# Patient Record
Sex: Male | Born: 1980 | Race: White | Hispanic: No | Marital: Single | Smoking: Current every day smoker
Health system: Southern US, Community
[De-identification: ages and names within clinical notes are randomized; demographics above are authoritative.]

## PROBLEM LIST (undated history)

## (undated) DIAGNOSIS — K219 Gastro-esophageal reflux disease without esophagitis: Secondary | ICD-10-CM

## (undated) DIAGNOSIS — R079 Chest pain, unspecified: Secondary | ICD-10-CM

## (undated) HISTORY — PX: OTHER SURGICAL HISTORY: SHX169

## (undated) HISTORY — PX: LAMINECTOMY: SHX219

## (undated) HISTORY — DX: Gastro-esophageal reflux disease without esophagitis: K21.9

## (undated) HISTORY — PX: SHOULDER SURGERY: SHX246

## (undated) HISTORY — DX: Chest pain, unspecified: R07.9

---

## 2016-03-11 ENCOUNTER — Other Ambulatory Visit: Payer: Self-pay | Admitting: Orthopaedic Surgery

## 2016-03-11 DIAGNOSIS — Z77018 Contact with and (suspected) exposure to other hazardous metals: Secondary | ICD-10-CM

## 2016-03-11 DIAGNOSIS — M47816 Spondylosis without myelopathy or radiculopathy, lumbar region: Secondary | ICD-10-CM

## 2016-03-23 ENCOUNTER — Ambulatory Visit
Admission: RE | Admit: 2016-03-23 | Discharge: 2016-03-23 | Disposition: A | Discharge status: Home / Self Care | Payer: 59 | Source: Ambulatory Visit | Attending: Orthopaedic Surgery | Admitting: Orthopaedic Surgery

## 2016-03-23 DIAGNOSIS — M47816 Spondylosis without myelopathy or radiculopathy, lumbar region: Secondary | ICD-10-CM

## 2016-03-23 DIAGNOSIS — Z77018 Contact with and (suspected) exposure to other hazardous metals: Secondary | ICD-10-CM

## 2020-07-09 ENCOUNTER — Other Ambulatory Visit: Payer: Self-pay

## 2020-07-09 ENCOUNTER — Encounter (HOSPITAL_COMMUNITY): Payer: Self-pay | Admitting: Emergency Medicine

## 2020-07-09 ENCOUNTER — Emergency Department (HOSPITAL_COMMUNITY)
Admission: EM | Admit: 2020-07-09 | Discharge: 2020-07-09 | Disposition: A | Discharge status: Home / Self Care | Payer: Worker's Compensation | Attending: Emergency Medicine | Admitting: Emergency Medicine

## 2020-07-09 ENCOUNTER — Emergency Department (HOSPITAL_COMMUNITY): Payer: Worker's Compensation

## 2020-07-09 DIAGNOSIS — S62635A Displaced fracture of distal phalanx of left ring finger, initial encounter for closed fracture: Secondary | ICD-10-CM | POA: Diagnosis not present

## 2020-07-09 DIAGNOSIS — Y990 Civilian activity done for income or pay: Secondary | ICD-10-CM | POA: Diagnosis not present

## 2020-07-09 DIAGNOSIS — W231XXA Caught, crushed, jammed, or pinched between stationary objects, initial encounter: Secondary | ICD-10-CM | POA: Diagnosis not present

## 2020-07-09 DIAGNOSIS — S61205A Unspecified open wound of left ring finger without damage to nail, initial encounter: Secondary | ICD-10-CM | POA: Diagnosis present

## 2020-07-09 DIAGNOSIS — Z23 Encounter for immunization: Secondary | ICD-10-CM | POA: Insufficient documentation

## 2020-07-09 DIAGNOSIS — S62639A Displaced fracture of distal phalanx of unspecified finger, initial encounter for closed fracture: Secondary | ICD-10-CM

## 2020-07-09 DIAGNOSIS — S61215A Laceration without foreign body of left ring finger without damage to nail, initial encounter: Secondary | ICD-10-CM | POA: Diagnosis not present

## 2020-07-09 MED ORDER — LIDOCAINE HCL (PF) 1 % IJ SOLN
10.0000 mL | Freq: Once | INTRAMUSCULAR | Status: AC
  Administered 2020-07-09: 10 mL
  Filled 2020-07-09: qty 10

## 2020-07-09 MED ORDER — CEFAZOLIN SODIUM 1 G IJ SOLR
1.0000 g | Freq: Once | INTRAMUSCULAR | Status: DC
  Filled 2020-07-09: qty 10

## 2020-07-09 MED ORDER — CEPHALEXIN 250 MG PO CAPS
500.0000 mg | ORAL_CAPSULE | Freq: Once | ORAL | Status: AC
  Administered 2020-07-09: 500 mg via ORAL
  Filled 2020-07-09: qty 2

## 2020-07-09 MED ORDER — TETANUS-DIPHTH-ACELL PERTUSSIS 5-2.5-18.5 LF-MCG/0.5 IM SUSY
0.5000 mL | PREFILLED_SYRINGE | Freq: Once | INTRAMUSCULAR | Status: AC
  Administered 2020-07-09: 0.5 mL via INTRAMUSCULAR
  Filled 2020-07-09: qty 0.5

## 2020-07-09 MED ORDER — CEPHALEXIN 500 MG PO CAPS: 500.0000 mg | ORAL_CAPSULE | Freq: Four times a day (QID) | ORAL | 0 refills | Status: DC

## 2020-07-09 NOTE — Discharge Instructions (Addendum)
Keep finger clean and dry in finger splint, you can remove the splint to change dressing once daily.  Keep dry for the first 24 hours then you may shower and wash her hands as needed but avoid soaking the finger in any water.  Take prescribed Keflex to help prevent infection.  Use ibuprofen and Tylenol as well as ice and elevation to help with pain.  Your x-ray did show a small fracture at the tip of your finger.  Please call to schedule follow-up appointment with Dr. Roney Mans with hand surgery  Monitor closely for signs of infection such as redness, swelling, increasing pain or puslike drainage, if this occurs please return to the ED.

## 2020-07-09 NOTE — ED Triage Notes (Signed)
Pt c/o left ring finger pain after getting it caught in a door. Seen at [facility] and sent here for further evaluation. Finger wrapped on arrival, bleeding controlled.

## 2020-07-09 NOTE — ED Provider Notes (Signed)
[facility] EMERGENCY DEPARTMENT Provider Note   CSN: [id number] Arrival date & time: 07/09/20  1504     History Chief Complaint  Patient presents with  . Finger Injury    Jerry Gutierrez is a 40 y.o. male.  Jerry Gutierrez is a 40 y.o. male who is otherwise healthy, presents to the ED from [facility] for evaluation of injury to the left ring finger.  Patient reports he got it caught in a hinged tailgate on the back of a truck as he was trying to step down and lost his balance grabbing the tailgate.  He reports he felt like his finger just popped open from the pressure and he has a laceration over the digit pad.  No damage to the nail noted.  He was initially seen at employee health but they felt that this was beyond what they could address and sent him to the emergency department for further evaluation.  He is able to flex and extend the finger and denies any numbness or tingling.  Reports there was initially bleeding but it is now controlled.  Unsure of last tetanus vaccine.  No other aggravating or alleviating factors.        History reviewed. No pertinent past medical history.  There are no problems to display for this patient.   History reviewed. No pertinent surgical history.     No family history on file.     Home Medications Prior to Admission medications   Medication Sig Start Date End Date Taking? Authorizing Provider  cephALEXin (KEFLEX) 500 MG capsule Take 1 capsule (500 mg total) by mouth 4 (four) times daily. 07/09/20  Yes Dartha Lodge, PA-C    Allergies    Penicillins  Review of Systems   Review of Systems  Constitutional: Negative for chills and fever.  Musculoskeletal: Negative for arthralgias and myalgias.  Skin: Positive for wound. Negative for color change.  Neurological: Negative for weakness and numbness.  All other systems reviewed and are negative.   Physical Exam Updated Vital Signs BP (!) 148/93 (BP Location: Right  Arm)   Pulse 74   Temp 99 F (37.2 C) (Temporal)   Resp 16   SpO2 100%   Physical Exam Vitals and nursing note reviewed.  Constitutional:      General: He is not in acute distress.    Appearance: Normal appearance. He is well-developed and well-nourished. He is not ill-appearing or diaphoretic.  HENT:     Head: Normocephalic and atraumatic.  Eyes:     General:        Right eye: No discharge.        Left eye: No discharge.  Pulmonary:     Effort: Pulmonary effort is normal. No respiratory distress.  Musculoskeletal:     Comments: 3 cm laceration extending from the lateral aspect of the finger to the medial digit pad of the left ring finger, no active bleeding, appears quite superficial no visible bone or tendon, full range of motion, normal sensation and cap refill.  No damage to the nail or subungual hematoma noted.  Skin:    General: Skin is warm and dry.  Neurological:     Mental Status: He is alert.     Coordination: Coordination normal.  Psychiatric:        Mood and Affect: Mood and affect normal.        Behavior: Behavior normal.     ED Results / Procedures / Treatments   Labs (all labs  ordered are listed, but only abnormal results are displayed) Labs Reviewed - No data to display  EKG None  Radiology DG Hand Complete Left  Result Date: 07/09/2020 CLINICAL DATA:  Finger injury EXAM: LEFT HAND - COMPLETE 3+ VIEW COMPARISON:  None. FINDINGS: Acute mildly comminuted and displaced fracture involving the lateral tuft of the fourth distal phalanx. No subluxation. No radiopaque foreign body. IMPRESSION: Acute mildly comminuted and displaced fracture involving the lateral tuft of the fourth distal phalanx. Electronically Signed   By: Jasmine Pang M.D.   On: 07/09/2020 16:14    Procedures .Marland KitchenLaceration Repair  Date/Time: 07/09/2020 6:55 PM Performed by: Dartha Lodge, PA-C Authorized by: Dartha Lodge, PA-C   Consent:    Consent obtained:  Verbal   Consent given by:   Patient   Risks, benefits, and alternatives were discussed: yes     Risks discussed:  Pain, infection, need for additional repair, poor cosmetic result, nerve damage and poor wound healing Universal protocol:    Procedure explained and questions answered to patient or proxy's satisfaction: yes     Patient identity confirmed:  Verbally with patient Anesthesia:    Anesthesia method:  Nerve block   Block needle gauge:  25 G   Block anesthetic:  Lidocaine 1% w/o epi   Block injection procedure:  Incremental injection, introduced needle and anatomic landmarks palpated   Block outcome:  Anesthesia achieved Laceration details:    Location:  Finger   Finger location:  L ring finger   Length (cm):  3   Depth (mm):  5 Pre-procedure details:    Preparation:  Patient was prepped and draped in usual sterile fashion and imaging obtained to evaluate for foreign bodies Exploration:    Hemostasis achieved with:  Direct pressure   Imaging obtained: x-ray     Imaging outcome: foreign body not noted     Wound exploration: wound explored through full range of motion and entire depth of wound visualized     Wound extent: areolar tissue violated and underlying fracture     Wound extent: no foreign bodies/material noted, no nerve damage noted, no tendon damage noted and no vascular damage noted     Wound extent comment:  There is an underlying distal tuft fracture, but the laceration is not directly over the bony area and the laceration is much more superficial Treatment:    Area cleansed with:  Saline   Amount of cleaning:  Extensive   Irrigation solution:  Sterile saline   Irrigation volume:  1L   Irrigation method:  Pressure wash Skin repair:    Repair method:  Sutures   Suture size:  5-0   Suture material:  Prolene   Suture technique:  Simple interrupted   Number of sutures:  5 Approximation:    Approximation:  Close Repair type:    Repair type:  Simple Post-procedure details:    Dressing:   Non-adherent dressing and splint for protection   Procedure completion:  Tolerated well, no immediate complications     Medications Ordered in ED Medications  ceFAZolin (ANCEF) injection 1 g (has no administration in time range)  lidocaine (PF) (XYLOCAINE) 1 % injection 10 mL (10 mLs Infiltration Given by Other 07/09/20 1809)  Tdap (BOOSTRIX) injection 0.5 mL (0.5 mLs Intramuscular Given 07/09/20 1809)    ED Course  I have reviewed the triage vital signs and the nursing notes.  Pertinent labs & imaging results that were available during my care of the patient were reviewed  by me and considered in my medical decision making (see chart for details).    MDM Rules/Calculators/A&P                         40 year old male presents with laceration to the left ring finger after he got it stuck in the truck tailgate.  No nailbed injury or subungual hematoma, 3 cm laceration extending from the lateral aspect of the finger onto the digit pad with no active bleeding, appears fairly superficial with no visible tendon or bone, full range of motion of the finger.  X-ray does show very small comminuted distal tuft fracture, but laceration does not directly overlie this area of the fracture communicate with bone therefore do not feel it is an open fracture.  Regardless injury occurred and gloved hand, will treat with antibiotics.  Tetanus updated.  Digital block completed and wound copiously irrigated with a liter of saline.  Closed with simple interrupted sutures with good cosmesis.  Dressing applied and patient placed in finger splint will have him follow-up with hand surgery.  Suture removal in 7-10 days.  Return precautions provided.  Discharged home in good condition.  Final Clinical Impression(s) / ED Diagnoses Final diagnoses:  Laceration of left ring finger without foreign body without damage to nail, initial encounter  Closed fracture of tuft of distal phalanx of finger    Rx / DC Orders ED  Discharge Orders         Ordered    cephALEXin (KEFLEX) 500 MG capsule  4 times daily        07/09/20 1853           Legrand Rams 07/10/20 0109    Gerhard Munch, MD 07/12/20 2356

## 2020-07-19 ENCOUNTER — Other Ambulatory Visit: Payer: Self-pay

## 2020-07-19 ENCOUNTER — Ambulatory Visit (HOSPITAL_COMMUNITY)
Admission: EM | Admit: 2020-07-19 | Discharge: 2020-07-19 | Disposition: A | Discharge status: Home / Self Care | Payer: Managed Care, Other (non HMO)

## 2020-07-19 DIAGNOSIS — Z4802 Encounter for removal of sutures: Secondary | ICD-10-CM

## 2020-07-19 NOTE — ED Triage Notes (Signed)
Presents for suture removal to left ring finger; sutures placed 07/09/20.  No S/S infections; pt denies any concerns.  Wound well-approximated with sutures intact.

## 2021-07-28 ENCOUNTER — Other Ambulatory Visit: Payer: Self-pay

## 2021-07-28 DIAGNOSIS — R079 Chest pain, unspecified: Secondary | ICD-10-CM | POA: Insufficient documentation

## 2021-07-28 DIAGNOSIS — F419 Anxiety disorder, unspecified: Secondary | ICD-10-CM | POA: Insufficient documentation

## 2021-07-28 DIAGNOSIS — K219 Gastro-esophageal reflux disease without esophagitis: Secondary | ICD-10-CM | POA: Insufficient documentation

## 2021-07-28 HISTORY — DX: Anxiety disorder, unspecified: F41.9

## 2021-07-29 ENCOUNTER — Other Ambulatory Visit: Payer: Self-pay

## 2021-07-29 ENCOUNTER — Ambulatory Visit: Payer: Managed Care, Other (non HMO) | Admitting: Cardiology

## 2021-07-29 ENCOUNTER — Encounter: Payer: Self-pay | Admitting: *Deleted

## 2021-07-29 VITALS — BP 170/80 | HR 82 | Ht 73.0 in | Wt 187.0 lb

## 2021-07-29 DIAGNOSIS — I10 Essential (primary) hypertension: Secondary | ICD-10-CM

## 2021-07-29 DIAGNOSIS — F17210 Nicotine dependence, cigarettes, uncomplicated: Secondary | ICD-10-CM

## 2021-07-29 DIAGNOSIS — R079 Chest pain, unspecified: Secondary | ICD-10-CM | POA: Diagnosis not present

## 2021-07-29 HISTORY — DX: Essential (primary) hypertension: I10

## 2021-07-29 HISTORY — DX: Nicotine dependence, cigarettes, uncomplicated: F17.210

## 2021-07-29 MED ORDER — LOSARTAN POTASSIUM 50 MG PO TABS: 50.0000 mg | ORAL_TABLET | Freq: Every day | ORAL | 3 refills | Status: DC

## 2021-07-29 MED ORDER — NITROGLYCERIN 0.4 MG SL SUBL: 0.4000 mg | SUBLINGUAL_TABLET | SUBLINGUAL | 6 refills | Status: DC | PRN

## 2021-07-29 MED ORDER — ASPIRIN EC 81 MG PO TBEC: 81.0000 mg | DELAYED_RELEASE_TABLET | Freq: Every day | ORAL | 3 refills | Status: AC

## 2021-07-29 NOTE — Progress Notes (Signed)
?Cardiology Office Note:   ? ?Date:  07/29/2021  ? ?ID:  Jerry Gutierrez, DOB 05-01-1981, MRN [id number] ? ?PCP:  Pcp, No  ?Cardiologist:  Garwin Brothers, MD  ? ?Referring MD: Webb Silversmith, MD  ? ? ?ASSESSMENT:   ? ?1. Chest pain of uncertain etiology   ?2. Chest pain, unspecified type   ?3. Essential hypertension   ?4. Cigarette smoker   ? ?PLAN:   ? ?In order of problems listed above: ? ?Primary prevention stressed with the patient.  Importance of compliance with diet medication stressed any vocalized understanding. ?Essential hypertension: Blood pressure is elevated and I have initiated him on losartan 50 mg daily. ?Chest pain: Following recommendations were made: He was advised to take a coated baby aspirin on a daily basis for the near future.  Sublingual nitroglycerin prescription was sent, its protocol and 911 protocol explained and the patient vocalized understanding questions were answered to the patient's satisfaction.  He will undergo exercise stress Cardiolite testing to assess for any evidence of obstructive coronary artery disease.  He knows to go to the nearest emergency room for any concerning symptoms. ?Cigarette smoker: I spent 5 minutes with the patient discussing solely about smoking. Smoking cessation was counseled. I suggested to the patient also different medications and pharmacological interventions. Patient is keen to try stopping on its own at this time. He will get back to me if he needs any further assistance in this matter. ?Cardiac murmur: Echocardiogram will be done to assess murmur heard on auscultation. ?Patient will be seen in follow-up appointment in 4 weeks or earlier if the patient has any concerns ? ? ? ?Medication Adjustments/Labs and Tests Ordered: ?Current medicines are reviewed at length with the patient today.  Concerns regarding medicines are outlined above.  ?Orders Placed This Encounter  ?Procedures  ? EKG 12-Lead  ? ?No orders of the defined types were placed in this  encounter. ? ? ? ?History of Present Illness:   ? ?Jerry Gutierrez is a 41 y.o. male who is being seen today for the evaluation of chest pain at the request of Webb Silversmith, MD. patient is a pleasant 41 year old male.  He has history of recently diagnosed hypertension.  He mentions to me that at the couple of doctors offices his blood pressure was elevated.  He is referred here for chest pain.  He mentions to me that is substernal in nature no radiation to the neck or to the arms.  He is unable to quantify or express more details about the chest pain.  He is concerned about it.  No syncope or dizziness.  At the time of my evaluation, the patient is alert awake oriented and in no distress. ? ?Past Medical History:  ?Diagnosis Date  ? Anxiety 07/28/2021  ? Chest pain   ? Esophageal reflux disease   ? ? ?Past Surgical History:  ?Procedure Laterality Date  ? arthroscopy Right   ? shoulder  ? LAMINECTOMY Right   ? L4-5  ? SHOULDER SURGERY Right   ? ? ?Current Medications: ?Current Meds  ?Medication Sig  ? Buprenorphine HCl-Naloxone HCl (ZUBSOLV) 5.7-1.4 MG SUBL Place 1.5 tablets under the tongue daily.  ? pantoprazole (PROTONIX) 40 MG tablet Take 40 mg by mouth 2 (two) times daily.  ?  ? ?Allergies:   Penicillins  ? ?Social History  ? ?Socioeconomic History  ? Marital status: Single  ?  Spouse name: Not on file  ? Number of children: Not on file  ? Years  of education: Not on file  ? Highest education level: Not on file  ?Occupational History  ? Not on file  ?Tobacco Use  ? Smoking status: Every Day  ?  Packs/day: 0.50  ?  Types: Cigarettes  ? Smokeless tobacco: Never  ?Substance and Sexual Activity  ? Alcohol use: Never  ? Drug use: Never  ? Sexual activity: Not on file  ?Other Topics Concern  ? Not on file  ?Social History Narrative  ? Not on file  ? ?Social Determinants of Health  ? ?Financial Resource Strain: Not on file  ?Food Insecurity: Not on file  ?Transportation Needs: Not on file  ?Physical Activity: Not on file   ?Stress: Not on file  ?Social Connections: Not on file  ?  ? ?Family History: ?The patient's family history includes Cancer in his maternal uncle; Diabetes in his maternal grandmother; Heart attack in his father. There is no history of Colon cancer. ? ?ROS:   ?Please see the history of present illness.    ?All other systems reviewed and are negative. ? ?EKGs/Labs/Other Studies Reviewed:   ? ?The following studies were reviewed today: ?EKG reveals sinus rhythm and nonspecific ST-T changes ? ? ?Recent Labs: ?No results found for requested labs within last 8760 hours.  ?Recent Lipid Panel ?No results found for: CHOL, TRIG, HDL, CHOLHDL, VLDL, LDLCALC, LDLDIRECT ? ?Physical Exam:   ? ?VS:  BP (!) 170/80 (BP Location: Left Arm, Patient Position: Sitting, Cuff Size: Normal)   Pulse 82   Ht 6\' 1"  (1.854 m)   Wt 187 lb (84.8 kg)   SpO2 99%   BMI 24.67 kg/m?    ? ?Wt Readings from Last 3 Encounters:  ?07/29/21 187 lb (84.8 kg)  ?  ? ?GEN: Patient is in no acute distress ?HEENT: Normal ?NECK: No JVD; No carotid bruits ?LYMPHATICS: No lymphadenopathy ?CARDIAC: S1 S2 regular, 2/6 systolic murmur at the apex. ?RESPIRATORY:  Clear to auscultation without rales, wheezing or rhonchi  ?ABDOMEN: Soft, non-tender, non-distended ?MUSCULOSKELETAL:  No edema; No deformity  ?SKIN: Warm and dry ?NEUROLOGIC:  Alert and oriented x 3 ?PSYCHIATRIC:  Normal affect  ? ? ?Signed, ?07/31/21, MD  ?07/29/2021 2:09 PM    ?[facility]   ?

## 2021-07-29 NOTE — Patient Instructions (Signed)
Medication Instructions:  ?Your physician has recommended you make the following change in your medication:  ? ?Start 81 mg coated Aspirin daily. ? ?Start Losartan 50 mg daily. ? ?Use nitroglycerin 1 tablet placed under the tongue at the first sign of chest pain or an angina attack. 1 tablet may be used every 5 minutes as needed, for up to 15 minutes. Do not take more than 3 tablets in 15 minutes. If pain persist call 911 or go to the nearest ED. ? ? ?*If you need a refill on your cardiac medications before your next appointment, please call your pharmacy* ? ? ?Lab Work: ?None ordered ?If you have labs (blood work) drawn today and your tests are completely normal, you will receive your results only by: ?MyChart Message (if you have MyChart) OR ?A paper copy in the mail ?If you have any lab test that is abnormal or we need to change your treatment, we will call you to review the results. ? ? ?Testing/Procedures: ?Your physician has requested that you have a lexiscan myoview. For further information please visit [email or web address]. Please follow instruction sheet, as given. ? ?The test will take approximately 3 to 4 hours to complete; you may bring reading material.  If someone comes with you to your appointment, they will need to remain in the main lobby due to limited space in the testing area.  ? ?How to prepare for your Myocardial Perfusion Test: ?Do not eat or drink 3 hours prior to your test, except you may have water. ?Do not consume products containing caffeine (regular or decaffeinated) 12 hours prior to your test. (ex: coffee, chocolate, sodas, tea). ?Do bring a list of your current medications with you.  If not listed below, you may take your medications as normal. ?Do wear comfortable clothes (no dresses or overalls) and walking shoes, tennis shoes preferred (No heels or open toe shoes are allowed). ?Do NOT wear cologne, perfume, aftershave, or lotions (deodorant is allowed). ?If these instructions are  not followed, your test will have to be rescheduled. ? ?Your physician has requested that you have an echocardiogram. Echocardiography is a painless test that uses sound waves to create images of your heart. It provides your doctor with information about the size and shape of your heart and how well your heart?s chambers and valves are working. This procedure takes approximately one hour. There are no restrictions for this procedure. ? ? ?Follow-Up: ?At [facility], you and your health needs are our priority.  As part of our continuing mission to provide you with exceptional heart care, we have created designated Provider Care Teams.  These Care Teams include your primary Cardiologist (physician) and Advanced Practice Providers (APPs -  Physician Assistants and Nurse Practitioners) who all work together to provide you with the care you need, when you need it. ? ?We recommend signing up for the patient portal called "MyChart".  Sign up information is provided on this After Visit Summary.  MyChart is used to connect with patients for Virtual Visits (Telemedicine).  Patients are able to view lab/test results, encounter notes, upcoming appointments, etc.  Non-urgent messages can be sent to your provider as well.   ?To learn more about what you can do with MyChart, go to [email or web address].   ? ?Your next appointment:   ?1 month(s) ? ?The format for your next appointment:   ?In Person ? ?Provider:   ?Jyl Heinz, MD ? ? ?Other Instructions ?Cardiac Nuclear Scan ?A cardiac nuclear scan is a test  that is done to check the flow of blood to your heart. It is done when you are resting and when you are exercising. The test looks for problems such as: ?Not enough blood reaching a portion of the heart. ?The heart muscle not working as it should. ?You may need this test if: ?You have heart disease. ?You have had lab results that are not normal. ?You have had heart surgery or a balloon procedure to open up blocked  arteries (angioplasty). ?You have chest pain. ?You have shortness of breath. ?In this test, a special dye (tracer) is put into your bloodstream. The tracer will travel to your heart. A camera will then take pictures of your heart to see how the tracer moves through your heart. This test is usually done at a hospital and takes 2-4 hours. ?Tell a doctor about: ?Any allergies you have. ?All medicines you are taking, including vitamins, herbs, eye drops, creams, and over-the-counter medicines. ?Any problems you or family members have had with anesthetic medicines. ?Any blood disorders you have. ?Any surgeries you have had. ?Any medical conditions you have. ?Whether you are pregnant or may be pregnant. ?What are the risks? ?Generally, this is a safe test. However, problems may occur, such as: ?Serious chest pain and heart attack. This is only a risk if the stress portion of the test is done. ?Rapid heartbeat. ?A feeling of warmth in your chest. This feeling usually does not last long. ?Allergic reaction to the tracer. ?What happens before the test? ?Ask your doctor about changing or stopping your normal medicines. This is important. ?Follow instructions from your doctor about what you cannot eat or drink. ?Remove your jewelry on the day of the test. ?What happens during the test? ?An IV tube will be inserted into one of your veins. ?Your doctor will give you a small amount of tracer through the IV tube. ?You will wait for 20-40 minutes while the tracer moves through your bloodstream. ?Your heart will be monitored with an electrocardiogram (ECG). ?You will lie down on an exam table. ?Pictures of your heart will be taken for about 15-20 minutes. ?You may also have a stress test. For this test, one of these things may be done: ?You will be asked to exercise on a treadmill or a stationary bike. ?You will be given medicines that will make your heart work harder. This is done if you are unable to exercise. ?When blood flow to  your heart has peaked, a tracer will again be given through the IV tube. ?After 20-40 minutes, you will get back on the exam table. More pictures will be taken of your heart. ?Depending on the tracer that is used, more pictures may need to be taken 3-4 hours later. ?Your IV tube will be removed when the test is over. ?The test may vary among doctors and hospitals. ?What happens after the test? ?Ask your doctor: ?Whether you can return to your normal schedule, including diet, activities, and medicines. ?Whether you should drink more fluids. This will help to remove the tracer from your body. Drink enough fluid to keep your pee (urine) pale yellow. ?Ask your doctor, or the department that is doing the test: ?When will my results be ready? ?How will I get my results? ?Summary ?A cardiac nuclear scan is a test that is done to check the flow of blood to your heart. ?Tell your doctor whether you are pregnant or may be pregnant. ?Before the test, ask your doctor about changing or stopping  your normal medicines. This is important. ?Ask your doctor whether you can return to your normal activities. You may be asked to drink more fluids. ?This information is not intended to replace advice given to you by your health care provider. Make sure you discuss any questions you have with your health care provider. ?Document Revised: 08/16/2018 Document Reviewed: 10/10/2017 ?Elsevier Patient Education ? [place]. ? ? ? ?Echocardiogram ?An echocardiogram is a test that uses sound waves (ultrasound) to produce images of the heart. ?Images from an echocardiogram can provide important information about: ?Heart size and shape. ?The size and thickness and movement of your heart's walls. ?Heart muscle function and strength. ?Heart valve function or if you have stenosis. Stenosis is when the heart valves are too narrow. ?If blood is flowing backward through the heart valves (regurgitation). ?A tumor or infectious growth around the  heart valves. ?Areas of heart muscle that are not working well because of poor blood flow or injury from a heart attack. ?Aneurysm detection. An aneurysm is a weak or damaged part of an artery wall. The wall bu

## 2021-08-04 ENCOUNTER — Telehealth: Payer: Self-pay | Admitting: *Deleted

## 2021-08-04 NOTE — Telephone Encounter (Signed)
Patient given detailed instructions per Myocardial Perfusion Study Information Sheet for the test on 08/11/21 at 1100. Patient notified to arrive 15 minutes early and that it is imperative to arrive on time for appointment to keep from having the test rescheduled. ? If you need to cancel or reschedule your appointment, please call the office within 24 hours of your appointment. . Patient verbalized understanding.Jerry Gutierrez, Jerry Gutierrez ? ? ?

## 2021-08-11 ENCOUNTER — Ambulatory Visit (INDEPENDENT_AMBULATORY_CARE_PROVIDER_SITE_OTHER): Payer: Managed Care, Other (non HMO)

## 2021-08-11 DIAGNOSIS — I517 Cardiomegaly: Secondary | ICD-10-CM

## 2021-08-11 DIAGNOSIS — R079 Chest pain, unspecified: Secondary | ICD-10-CM

## 2021-08-11 DIAGNOSIS — I340 Nonrheumatic mitral (valve) insufficiency: Secondary | ICD-10-CM

## 2021-08-11 LAB — ECHOCARDIOGRAM COMPLETE
Area-P 1/2: 4.33 cm2
Height: 73 in
S' Lateral: 3.2 cm
Weight: 2992 oz

## 2021-08-11 MED ORDER — TECHNETIUM TC 99M TETROFOSMIN IV KIT
29.4000 | PACK | Freq: Once | INTRAVENOUS | Status: AC | PRN
  Administered 2021-08-11: 29.4 via INTRAVENOUS

## 2021-08-11 MED ORDER — TECHNETIUM TC 99M TETROFOSMIN IV KIT
10.6000 | PACK | Freq: Once | INTRAVENOUS | Status: AC | PRN
  Administered 2021-08-11: 10.6 via INTRAVENOUS

## 2021-08-12 LAB — MYOCARDIAL PERFUSION IMAGING
Angina Index: 0
Duke Treadmill Score: 1
Estimated workload: 12.9
Exercise duration (min): 10 min
Exercise duration (sec): 45 s
LV dias vol: 122 mL (ref 62–150)
LV sys vol: 46 mL
MPHR: 182 {beats}/min
Nuc Stress EF: 62 %
Peak HR: 171 {beats}/min
Percent HR: 93 %
Rest HR: 64 {beats}/min
Rest Nuclear Isotope Dose: 10.6 mCi
SDS: 1
SRS: 1
SSS: 2
ST Depression (mm): 2 mm
Stress Nuclear Isotope Dose: 29.4 mCi
TID: 0.85

## 2021-08-26 ENCOUNTER — Other Ambulatory Visit: Payer: Self-pay

## 2021-08-31 ENCOUNTER — Ambulatory Visit: Payer: Managed Care, Other (non HMO) | Admitting: Cardiology

## 2021-08-31 ENCOUNTER — Encounter: Payer: Self-pay | Admitting: Cardiology

## 2021-08-31 VITALS — BP 122/68 | HR 72 | Ht 73.0 in | Wt 183.8 lb

## 2021-08-31 DIAGNOSIS — F17210 Nicotine dependence, cigarettes, uncomplicated: Secondary | ICD-10-CM

## 2021-08-31 DIAGNOSIS — I10 Essential (primary) hypertension: Secondary | ICD-10-CM

## 2021-08-31 DIAGNOSIS — F419 Anxiety disorder, unspecified: Secondary | ICD-10-CM

## 2021-08-31 NOTE — Patient Instructions (Addendum)
Medication Instructions:  ?Your physician recommends that you continue on your current medications as directed. Please refer to the Current Medication list given to you today.  ?*If you need a refill on your cardiac medications before your next appointment, please call your pharmacy* ? ? ?Lab Work: ?Your physician recommends that you have labs done in the office today. Your test included  basic metabolic panel, liver function and lipids. ? ?If you have labs (blood work) drawn today and your tests are completely normal, you will receive your results only by: ?MyChart Message (if you have MyChart) OR ?A paper copy in the mail ?If you have any lab test that is abnormal or we need to change your treatment, we will call you to review the results. ? ? ?Testing/Procedures: ?We will order CT coronary calcium score. ?It will cost $99.00 and is not covered by insurance.  ?Please call to schedule.   ? ?[facility]  ?[address]  ?[place], [place] [address] ?[phone] ?           Or ?[facility] ?[address] ?[place], [place] [address] ?[phone] ? ? ? ?Follow-Up: ?At [facility], you and your health needs are our priority.  As part of our continuing mission to provide you with exceptional heart care, we have created designated Provider Care Teams.  These Care Teams include your primary Cardiologist (physician) and Advanced Practice Providers (APPs -  Physician Assistants and Nurse Practitioners) who all work together to provide you with the care you need, when you need it. ? ?We recommend signing up for the patient portal called "MyChart".  Sign up information is provided on this After Visit Summary.  MyChart is used to connect with patients for Virtual Visits (Telemedicine).  Patients are able to view lab/test results, encounter notes, upcoming appointments, etc.  Non-urgent messages can be sent to your provider as well.   ?To learn more about what you can do with MyChart, go to  [email or web address].   ? ?Your next appointment:   ?9 month(s) ? ?The format for your next appointment:   ?In Person ? ?Provider:   ?Belva Crome, MD ? ? ?Other Instructions ? ? ?

## 2021-08-31 NOTE — Progress Notes (Signed)
?Cardiology Office Note:   ? ?Date:  08/31/2021  ? ?ID:  Jerry Gutierrez, DOB [birth year], MRN [id number] ? ?PCP:  Pcp, No  ?Cardiologist:  Jenean Lindau, MD  ? ?Referring MD: No ref. provider found  ? ? ?ASSESSMENT:   ? ?1. Essential hypertension   ?2. Cigarette smoker   ?3. Anxiety   ? ?PLAN:   ? ?In order of problems listed above: ? ?Primary prevention stressed with the patient.  Importance of compliance with diet medication stressed any vocalized understanding.  He was advised to walk at least half an hour a day 5 days a week and he promises to do so. ?Essential hypertension: Blood pressure stable and diet was emphasized.  Lifestyle modification urged.  He is happy about his blood pressure issues. ?Cigarette smoker: I spent 5 minutes with the patient discussing solely about smoking. Smoking cessation was counseled. I suggested to the patient also different medications and pharmacological interventions. Patient is keen to try stopping on its own at this time. He will get back to me if he needs any further assistance in this matter. ?For risk stratification he was advised to undergo calcium CT scoring and he is agreeable.  We will also do blood work for lipids today.  We will do a Chem-7 liver lipid check.  He was advised to get established with primary care and he promises to do so. ?Patient will be seen in follow-up appointment in 6 months or earlier if the patient has any concerns ? ? ? ?Medication Adjustments/Labs and Tests Ordered: ?Current medicines are reviewed at length with the patient today.  Concerns regarding medicines are outlined above.  ?Orders Placed This Encounter  ?Procedures  ? CT CARDIAC SCORING  ? Basic metabolic panel  ? Hepatic function panel  ? Lipid panel  ? ?No orders of the defined types were placed in this encounter. ? ? ? ?No chief complaint on file. ?  ? ?History of Present Illness:   ? ?Jerry Gutierrez is a 41 y.o. male.  Patient has past medical history of essential hypertension and  cigarette smoking.  He denies any problems at this time and takes care of activities of daily living.  No chest pain orthopnea or PND.  He underwent echocardiogram and stress testing and they are unremarkable.  His effort tolerance is good.  Unfortunately continues to smoke.  At the time of my evaluation, the patient is alert awake oriented and in no distress. ? ?Past Medical History:  ?Diagnosis Date  ? Anxiety 07/28/2021  ? Chest pain   ? Cigarette smoker 07/29/2021  ? Esophageal reflux disease   ? Essential hypertension 07/29/2021  ? ? ?Past Surgical History:  ?Procedure Laterality Date  ? arthroscopy Right   ? shoulder  ? LAMINECTOMY Right   ? L4-5  ? SHOULDER SURGERY Right   ? ? ?Current Medications: ?Current Meds  ?Medication Sig  ? aspirin EC 81 MG tablet Take 1 tablet (81 mg total) by mouth daily. Swallow whole.  ? Buprenorphine HCl-Naloxone HCl (ZUBSOLV) 5.7-1.4 MG SUBL Place 1.5 tablets under the tongue daily.  ? losartan (COZAAR) 50 MG tablet Take 1 tablet (50 mg total) by mouth daily.  ? nitroGLYCERIN (NITROSTAT) 0.4 MG SL tablet Place 0.4 mg under the tongue every 5 (five) minutes as needed for chest pain.  ? pantoprazole (PROTONIX) 40 MG tablet Take 40 mg by mouth 2 (two) times daily.  ?  ? ?Allergies:   Penicillins  ? ?Social History  ? ?Socioeconomic History  ?  Marital status: Single  ?  Spouse name: Not on file  ? Number of children: Not on file  ? Years of education: Not on file  ? Highest education level: Not on file  ?Occupational History  ? Not on file  ?Tobacco Use  ? Smoking status: Every Day  ?  Packs/day: 0.50  ?  Types: Cigarettes  ? Smokeless tobacco: Never  ?Substance and Sexual Activity  ? Alcohol use: Never  ? Drug use: Never  ? Sexual activity: Not on file  ?Other Topics Concern  ? Not on file  ?Social History Narrative  ? Not on file  ? ?Social Determinants of Health  ? ?Financial Resource Strain: Not on file  ?Food Insecurity: Not on file  ?Transportation Needs: Not on file  ?Physical  Activity: Not on file  ?Stress: Not on file  ?Social Connections: Not on file  ?  ? ?Family History: ?The patient's family history includes Cancer in his maternal uncle; Diabetes in his maternal grandmother; Heart attack in his father. There is no history of Colon cancer. ? ?ROS:   ?Please see the history of present illness.    ?All other systems reviewed and are negative. ? ?EKGs/Labs/Other Studies Reviewed:   ? ?The following studies were reviewed today: ?Results of echo and stress test discussed with the patient at length. ? ? ?Recent Labs: ?No results found for requested labs within last 8760 hours.  ?Recent Lipid Panel ?No results found for: CHOL, TRIG, HDL, CHOLHDL, VLDL, LDLCALC, LDLDIRECT ? ?Physical Exam:   ? ?VS:  BP 122/68   Pulse 72   Ht 6\' 1"  (1.854 m)   Wt 183 lb 12.8 oz (83.4 kg)   SpO2 96%   BMI 24.25 kg/m?    ? ?Wt Readings from Last 3 Encounters:  ?08/31/21 183 lb 12.8 oz (83.4 kg)  ?08/11/21 187 lb (84.8 kg)  ?07/29/21 187 lb (84.8 kg)  ?  ? ?GEN: Patient is in no acute distress ?HEENT: Normal ?NECK: No JVD; No carotid bruits ?LYMPHATICS: No lymphadenopathy ?CARDIAC: Hear sounds regular, 2/6 systolic murmur at the apex. ?RESPIRATORY:  Clear to auscultation without rales, wheezing or rhonchi  ?ABDOMEN: Soft, non-tender, non-distended ?MUSCULOSKELETAL:  No edema; No deformity  ?SKIN: Warm and dry ?NEUROLOGIC:  Alert and oriented x 3 ?PSYCHIATRIC:  Normal affect  ? ?Signed, ?Jenean Lindau, MD  ?08/31/2021 8:26 AM    ?[place]  ?

## 2021-09-01 LAB — BASIC METABOLIC PANEL
BUN/Creatinine Ratio: 8 — ABNORMAL LOW (ref 9–20)
BUN: 9 mg/dL (ref 6–20)
CO2: 29 mmol/L (ref 20–29)
Calcium: 9.8 mg/dL (ref 8.7–10.2)
Chloride: 101 mmol/L (ref 96–106)
Creatinine, Ser: 1.08 mg/dL (ref 0.76–1.27)
Glucose: 98 mg/dL (ref 70–99)
Potassium: 4.4 mmol/L (ref 3.5–5.2)
Sodium: 142 mmol/L (ref 134–144)
eGFR: 90 mL/min/{1.73_m2} (ref >59)

## 2021-09-01 LAB — LIPID PANEL
Chol/HDL Ratio: 4.2 ratio (ref 0.0–5.0)
Cholesterol, Total: 177 mg/dL (ref 100–199)
HDL: 42 mg/dL (ref >39)
LDL Chol Calc (NIH): 122 mg/dL — ABNORMAL HIGH (ref 0–99)
Triglycerides: 66 mg/dL (ref 0–149)
VLDL Cholesterol Cal: 13 mg/dL (ref 5–40)

## 2021-09-01 LAB — HEPATIC FUNCTION PANEL
ALT: 14 IU/L (ref 0–44)
AST: 17 IU/L (ref 0–40)
Albumin: 4.6 g/dL (ref 4.0–5.0)
Alkaline Phosphatase: 52 IU/L (ref 44–121)
Bilirubin Total: 0.3 mg/dL (ref 0.0–1.2)
Bilirubin, Direct: 0.11 mg/dL (ref 0.00–0.40)
Total Protein: 6.5 g/dL (ref 6.0–8.5)

## 2021-10-15 ENCOUNTER — Ambulatory Visit
Admission: RE | Admit: 2021-10-15 | Discharge: 2021-10-15 | Disposition: A | Discharge status: Home / Self Care | Payer: Self-pay | Source: Ambulatory Visit | Attending: Cardiology | Admitting: Cardiology

## 2021-10-15 DIAGNOSIS — I10 Essential (primary) hypertension: Secondary | ICD-10-CM

## 2021-10-15 DIAGNOSIS — F17210 Nicotine dependence, cigarettes, uncomplicated: Secondary | ICD-10-CM

## 2022-06-24 ENCOUNTER — Other Ambulatory Visit: Payer: Self-pay

## 2022-06-25 ENCOUNTER — Encounter: Payer: Self-pay | Admitting: Cardiology

## 2022-06-25 ENCOUNTER — Ambulatory Visit: Payer: Managed Care, Other (non HMO) | Attending: Cardiology | Admitting: Cardiology

## 2022-06-25 VITALS — BP 124/70 | HR 70 | Ht 73.0 in | Wt 191.8 lb

## 2022-06-25 DIAGNOSIS — F17210 Nicotine dependence, cigarettes, uncomplicated: Secondary | ICD-10-CM | POA: Diagnosis not present

## 2022-06-25 DIAGNOSIS — I10 Essential (primary) hypertension: Secondary | ICD-10-CM

## 2022-06-25 NOTE — Patient Instructions (Signed)
Medication Instructions:  Your physician recommends that you continue on your current medications as directed. Please refer to the Current Medication list given to you today.  *If you need a refill on your cardiac medications before your next appointment, please call your pharmacy*   Lab Work: NONE If you have labs (blood work) drawn today and your tests are completely normal, you will receive your results only by: [facility] (if you have MyChart) OR A paper copy in the mail If you have any lab test that is abnormal or we need to change your treatment, we will call you to review the results.   Testing/Procedures: NONE   Follow-Up: At [facility], you and your health needs are our priority.  As part of our continuing mission to provide you with exceptional heart care, we have created designated Provider Care Teams.  These Care Teams include your primary Cardiologist (physician) and Advanced Practice Providers (APPs -  Physician Assistants and Nurse Practitioners) who all work together to provide you with the care you need, when you need it.  We recommend signing up for the patient portal called "MyChart".  Sign up information is provided on this After Visit Summary.  MyChart is used to connect with patients for Virtual Visits (Telemedicine).  Patients are able to view lab/test results, encounter notes, upcoming appointments, etc.  Non-urgent messages can be sent to your provider as well.   To learn more about what you can do with MyChart, go to [email or web address].    Your next appointment:   1 year(s)  Provider:   Jyl Heinz, MD    Other Instructions

## 2022-06-25 NOTE — Progress Notes (Signed)
Cardiology Office Note:    Date:  06/25/2022   ID:  Jerry Gutierrez, DOB 10/06/1980, MRN [id number]  PCP:  Pcp, No  Cardiologist:  Jenean Lindau, MD   Referring MD: No ref. provider Gutierrez    ASSESSMENT:    1. Essential hypertension   2. Cigarette smoker    PLAN:    In order of problems listed above:  Primary prevention stressed with the patient.  Importance of compliance with diet medication stressed and he vocalized understanding.  He was advised to walk at least half an hour a day 5 days a week and he promises to do so. Essential hypertension: Blood pressure is stable and diet was emphasized.  Lifestyle modification urged.  His blood pressure is now stable and he is happy about it. Cigarette smoker: I spent 5 minutes with the patient discussing solely about smoking. Smoking cessation was counseled. I suggested to the patient also different medications and pharmacological interventions. Patient is keen to try stopping on its own at this time. He will get back to me if he needs any further assistance in this matter. Patient will be seen in follow-up appointment in 12 months or earlier if the patient has any concerns    Medication Adjustments/Labs and Tests Ordered: Current medicines are reviewed at length with the patient today.  Concerns regarding medicines are outlined above.  No orders of the defined types were placed in this encounter.  No orders of the defined types were placed in this encounter.    No chief complaint on file.    History of Present Illness:    Jerry Gutierrez is a 42 y.o. male.  Patient has past medical history of essential hypertension smoking.  He was evaluated by me for chest pain.  His calcium score is 0.  He is an active gentleman now and exercises without any problems.  Also his work is physical.  No chest pain no orthopnea or PND.  Unfortunately continues to smoke.  At the time of my evaluation, the patient is alert awake oriented and in no  distress.  Past Medical History:  Diagnosis Date   Anxiety 07/28/2021   Chest pain    Cigarette smoker 07/29/2021   Esophageal reflux disease    Essential hypertension 07/29/2021    Past Surgical History:  Procedure Laterality Date   arthroscopy Right    shoulder   LAMINECTOMY Right    L4-5   SHOULDER SURGERY Right     Current Medications: Current Meds  Medication Sig   aspirin EC 81 MG tablet Take 1 tablet (81 mg total) by mouth daily. Swallow whole.   Buprenorphine HCl-Naloxone HCl (ZUBSOLV) 5.7-1.4 MG SUBL Place 1.5 tablets under the tongue daily.   losartan (COZAAR) 50 MG tablet Take 50 mg by mouth daily.   nitroGLYCERIN (NITROSTAT) 0.4 MG SL tablet Place 0.4 mg under the tongue every 5 (five) minutes as needed for chest pain.   pantoprazole (PROTONIX) 40 MG tablet Take 40 mg by mouth 2 (two) times daily.     Allergies:   Penicillins   Social History   Socioeconomic History   Marital status: Single    Spouse name: Not on file   Number of children: Not on file   Years of education: Not on file   Highest education level: Not on file  Occupational History   Not on file  Tobacco Use   Smoking status: Every Day    Packs/day: 0.50    Types: Cigarettes   Smokeless  tobacco: Never  Substance and Sexual Activity   Alcohol use: Never   Drug use: Never   Sexual activity: Not on file  Other Topics Concern   Not on file  Social History Narrative   Not on file   Social Determinants of Health   Financial Resource Strain: Not on file  Food Insecurity: Not on file  Transportation Needs: Not on file  Physical Activity: Not on file  Stress: Not on file  Social Connections: Not on file     Family History: The patient's family history includes Cancer in his maternal uncle; Diabetes in his maternal grandmother; Heart attack in his father. There is no history of Colon cancer.  ROS:   Please see the history of present illness.    All other systems reviewed and are  negative.  EKGs/Labs/Other Studies Reviewed:    The following studies were reviewed today: I discussed the findings with the patient at length and his calcium score was 0.   Recent Labs: 08/31/2021: ALT 14; BUN 9; Creatinine, Ser 1.08; Potassium 4.4; Sodium 142  Recent Lipid Panel    Component Value Date/Time   CHOL 177 08/31/2021 0828   TRIG 66 08/31/2021 0828   HDL 42 08/31/2021 0828   CHOLHDL 4.2 08/31/2021 0828   LDLCALC 122 (H) 08/31/2021 0828    Physical Exam:    VS:  BP 124/70   Pulse 70   Ht 6' 1"$  (1.854 m)   Wt 191 lb 12.8 oz (87 kg)   SpO2 98%   BMI 25.30 kg/m     Wt Readings from Last 3 Encounters:  06/25/22 191 lb 12.8 oz (87 kg)  08/31/21 183 lb 12.8 oz (83.4 kg)  08/11/21 187 lb (84.8 kg)     GEN: Patient is in no acute distress HEENT: Normal NECK: No JVD; No carotid bruits LYMPHATICS: No lymphadenopathy CARDIAC: Hear sounds regular, 2/6 systolic murmur at the apex. RESPIRATORY:  Clear to auscultation without rales, wheezing or rhonchi  ABDOMEN: Soft, non-tender, non-distended MUSCULOSKELETAL:  No edema; No deformity  SKIN: Warm and dry NEUROLOGIC:  Alert and oriented x 3 PSYCHIATRIC:  Normal affect   Signed, Jenean Lindau, MD  06/25/2022 1:58 PM    [facility]

## 2022-07-14 ENCOUNTER — Other Ambulatory Visit: Payer: Self-pay | Admitting: Cardiology

## 2023-03-09 IMAGING — CT CT CARDIAC CORONARY ARTERY CALCIUM SCORE
3 series · 14 of 20 positions shown, 16 images · non-contrast
Comparison: None.
COMPARISON: None.

Addendum:
EXAM:
OVER-READ INTERPRETATION CARDIAC CT CHEST

The following report is an over-read performed by radiologist Dr.
Knaan Sneineh [REDACTED] on 10/15/2021. This
over-read does not include interpretation of cardiac or coronary
anatomy or pathology. The coronary calcium score interpretation by
the cardiologist is attached.
CLINICAL DATA: Cardiovascular Disease Risk stratification
Coronary Calcium Score
TECHNIQUE: A gated, non-contrast computed tomography scan of the heart was
performed using 3mm slice thickness. Axial images were analyzed on a
dedicated workstation. Calcium scoring of the coronary arteries was
performed using the Agatston method.

[Series 2: cascseq 2.0 sa36 (id) (id) · axial · 0.40mm/px · z∈[-274,-184]mm · 4 of 76 slices shown]
[im 16/76  vessel]
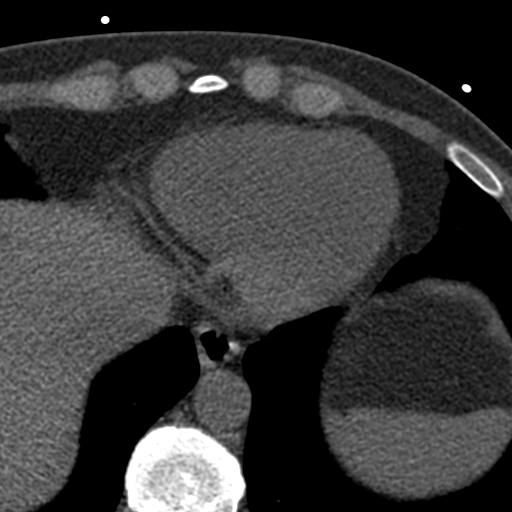
[im 31/76  vessel]
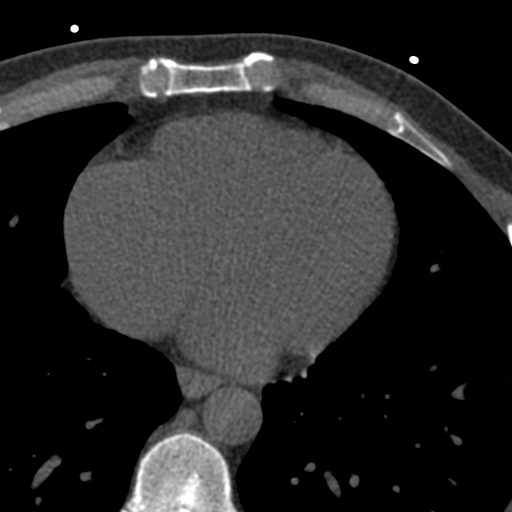
[im 46/76  vessel]
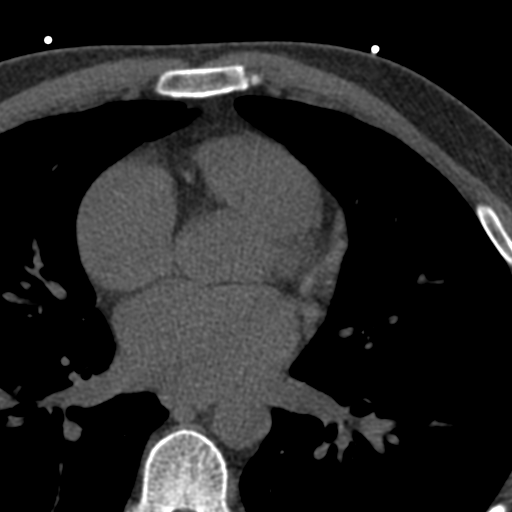
[im 61/76  vessel]
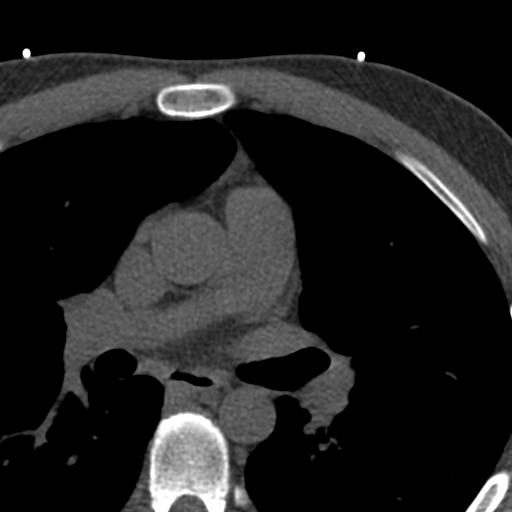

[Series 3: cascseq 2.0 bf37 st · axial · 0.74mm/px · z∈[-280,-180]mm · 5 of 76 slices shown, 7 images]
[im 13/76  vessel]
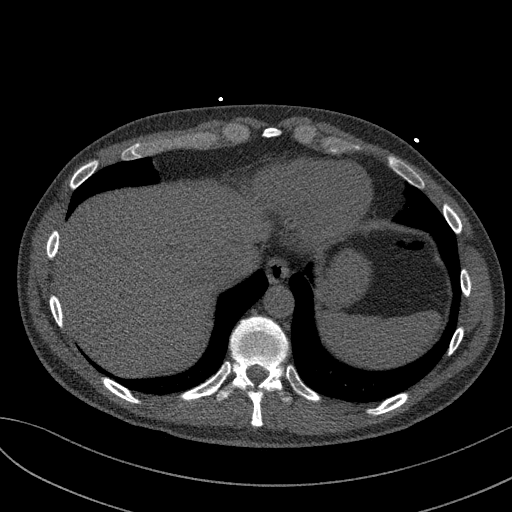
[im 13/76  lung]
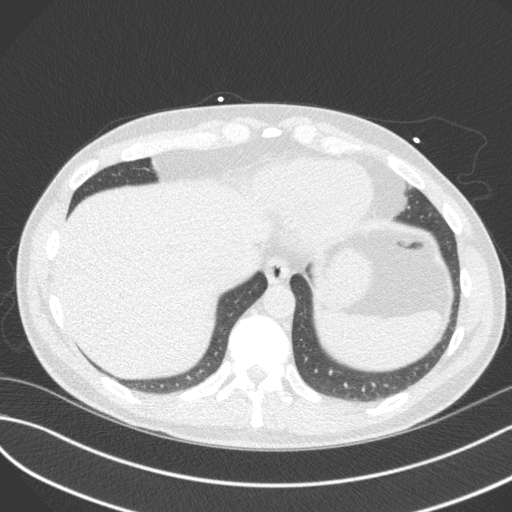
[im 26/76  vessel]
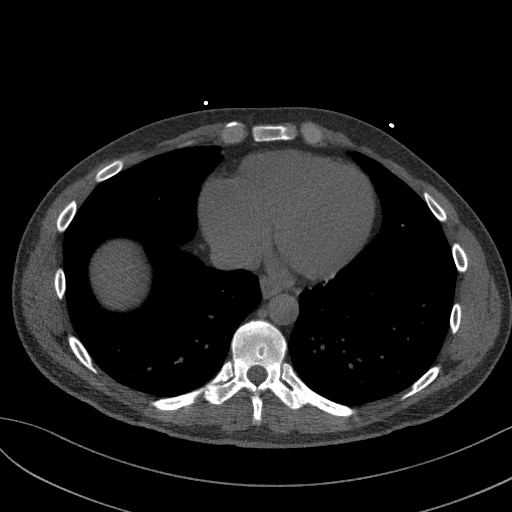
[im 38/76  vessel]
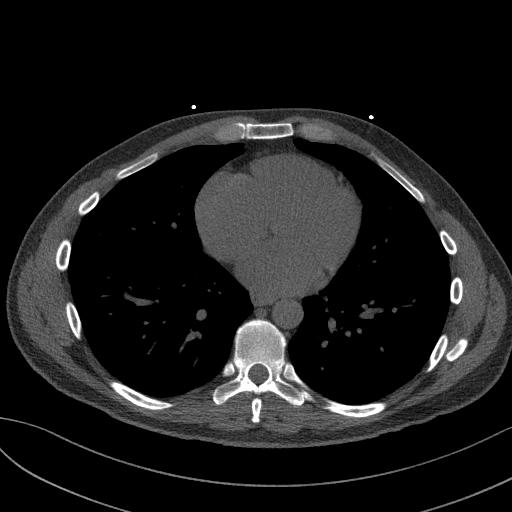
[im 51/76  vessel]
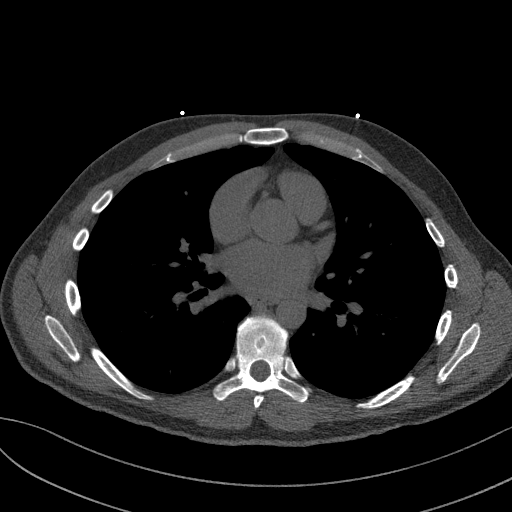
[im 63/76  vessel]
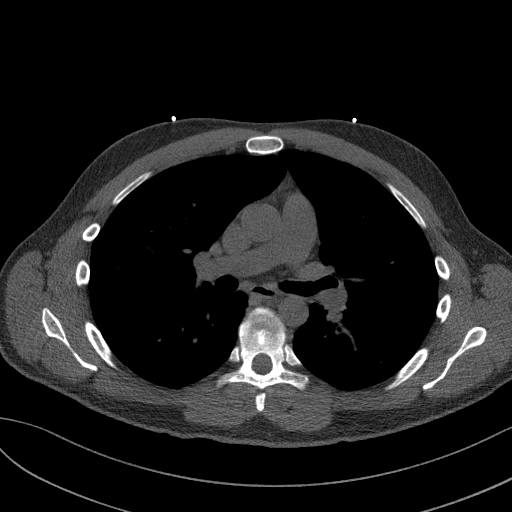
[im 63/76  lung]
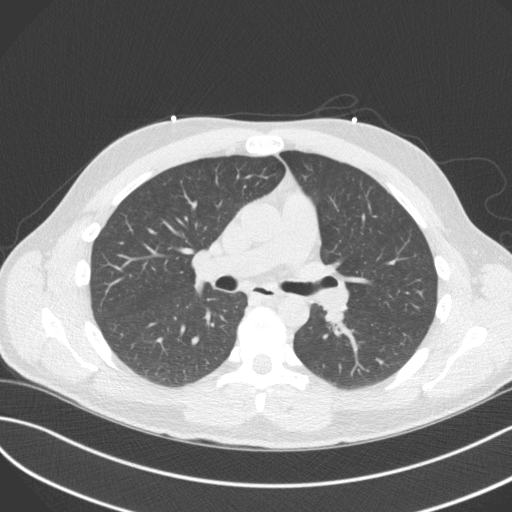

[Series 4: cascseq 2.0 br59 lung · axial · 0.74mm/px · z∈[-280,-180]mm · 5 of 76 slices shown]
[im 13/76  lung]
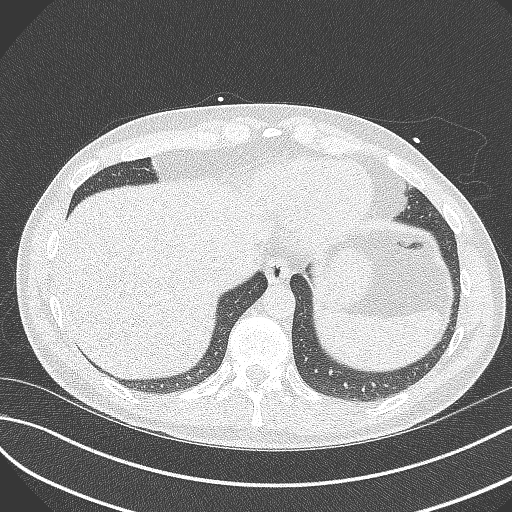
[im 26/76  lung]
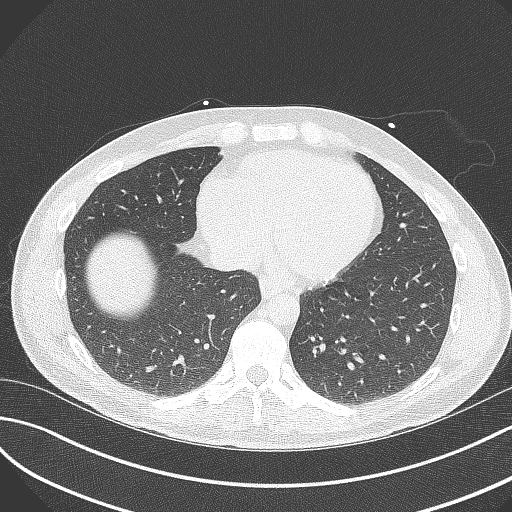
[im 38/76  lung]
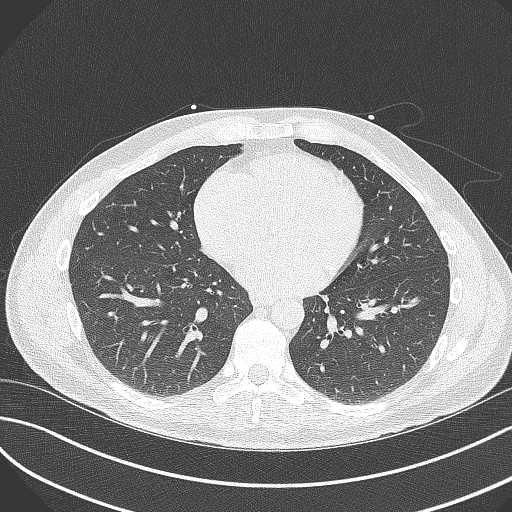
[im 51/76  lung]
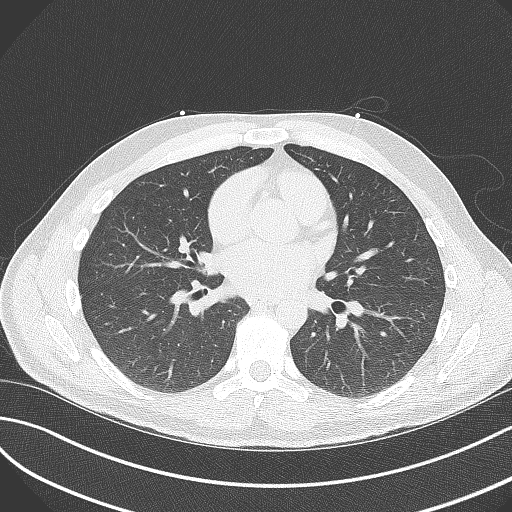
[im 63/76  lung]
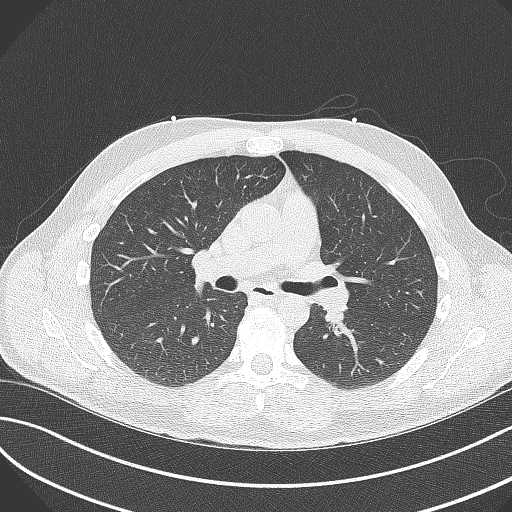

[14 of 20 positions shown; findings below may reference images not displayed]

FINDINGS: Extracardiac Vascular: Unremarkable

Mediastinum: Unremarkable

Lung: Unremarkable

Included Upper Abdomen: 9 mm and separate 6 mm hypodense lesions
along the dome of the liver on images 59-60 of series 3, highly
likely to be small cysts although technically nonspecific due to
lack of IV contrast and small size. This vertical level was not
included on the CT urogram from 07/12/2004.

Musculoskeletal: Mild thoracic spondylosis.
IMPRESSION: 1. Two hypodense lesions in the dome of the liver are highly likely
to be cysts or similar benign structures and do not require further
workup.
2. Mild thoracic spondylosis.
FINDINGS: Coronary Calcium Score:

Left main: 0

Left anterior descending artery: 0

Left circumflex artery: 0

Right coronary artery: 0

Total: 0

Percentile: 0

Pericardium: Normal.

Ascending Aorta: Normal caliber.

Non-cardiac: See separate report from [REDACTED].
IMPRESSION: Coronary calcium score of 0. This was 0 percentile for age-, race-,
and sex-matched controls.



If CAC=0, it is reasonable to withhold statin therapy and reassess
in 5 to 10 years, as long as higher risk conditions are absent
(diabetes mellitus, family history of premature CHD in first degree
relatives (males <55 years; females <65 years), cigarette smoking,
or LDL >=190 mg/dL).

If CAC is 1 to 99, it is reasonable to initiate statin therapy for
patients >=55 years of age.

If CAC is >=100 or >=75th percentile, it is reasonable to initiate
statin therapy at any age.

Cardiology referral should be considered for patients with CAC
scores >=400 or >=75th percentile.

*8686 AHA/ACC/AACVPR/AAPA/ABC/COLLING/LABRIE/TIGER/Akoja/IMANE/RONE/BADIN
Guideline on the Management of Blood Cholesterol: A Report of the
American College of Cardiology/American Heart Association Task Force
on Clinical Practice Guidelines. J Am Coll Cardiol.
7351;73(24):4727-4288.

*** End of Addendum ***
EXAM:
OVER-READ INTERPRETATION CARDIAC CT CHEST

The following report is an over-read performed by radiologist Dr.
Knaan Sneineh [REDACTED] on 10/15/2021. This
over-read does not include interpretation of cardiac or coronary
anatomy or pathology. The coronary calcium score interpretation by
the cardiologist is attached.
FINDINGS: Extracardiac Vascular: Unremarkable

Mediastinum: Unremarkable

Lung: Unremarkable

Included Upper Abdomen: 9 mm and separate 6 mm hypodense lesions
along the dome of the liver on images 59-60 of series 3, highly
likely to be small cysts although technically nonspecific due to
lack of IV contrast and small size. This vertical level was not
included on the CT urogram from 07/12/2004.

Musculoskeletal: Mild thoracic spondylosis.
IMPRESSION: 1. Two hypodense lesions in the dome of the liver are highly likely
to be cysts or similar benign structures and do not require further
workup.
2. Mild thoracic spondylosis.

## 2023-04-14 ENCOUNTER — Other Ambulatory Visit: Payer: Self-pay | Admitting: Cardiology

## 2023-06-11 ENCOUNTER — Other Ambulatory Visit: Payer: Self-pay | Admitting: Cardiology

## 2023-06-14 ENCOUNTER — Other Ambulatory Visit: Payer: Self-pay | Admitting: Cardiology

## 2023-07-13 ENCOUNTER — Other Ambulatory Visit: Payer: Self-pay | Admitting: Cardiology
